# Patient Record
Sex: Male | Born: 1935 | Race: White | Hispanic: No | Smoking: Former smoker
Health system: Southern US, Community
[De-identification: ages and names within clinical notes are randomized; demographics above are authoritative.]

## PROBLEM LIST (undated history)

## (undated) DIAGNOSIS — N390 Urinary tract infection, site not specified: Secondary | ICD-10-CM

## (undated) DIAGNOSIS — M4800 Spinal stenosis, site unspecified: Secondary | ICD-10-CM

## (undated) DIAGNOSIS — E785 Hyperlipidemia, unspecified: Secondary | ICD-10-CM

## (undated) DIAGNOSIS — N400 Enlarged prostate without lower urinary tract symptoms: Secondary | ICD-10-CM

## (undated) DIAGNOSIS — M1990 Unspecified osteoarthritis, unspecified site: Secondary | ICD-10-CM

## (undated) DIAGNOSIS — I10 Essential (primary) hypertension: Secondary | ICD-10-CM

## (undated) DIAGNOSIS — M5126 Other intervertebral disc displacement, lumbar region: Secondary | ICD-10-CM

## (undated) DIAGNOSIS — M5136 Other intervertebral disc degeneration, lumbar region: Secondary | ICD-10-CM

## (undated) DIAGNOSIS — I639 Cerebral infarction, unspecified: Secondary | ICD-10-CM

## (undated) HISTORY — DX: Benign prostatic hyperplasia without lower urinary tract symptoms: N40.0

## (undated) HISTORY — PX: APPENDECTOMY: SHX54

## (undated) HISTORY — DX: Hyperlipidemia, unspecified: E78.5

## (undated) HISTORY — DX: Spinal stenosis, site unspecified: M48.00

## (undated) HISTORY — DX: Cerebral infarction, unspecified: I63.9

## (undated) HISTORY — DX: Unspecified osteoarthritis, unspecified site: M19.90

## (undated) HISTORY — DX: Urinary tract infection, site not specified: N39.0

---

## 2005-07-04 ENCOUNTER — Other Ambulatory Visit: Payer: Self-pay

## 2005-07-04 ENCOUNTER — Emergency Department: Payer: Self-pay | Admitting: Emergency Medicine

## 2005-07-10 ENCOUNTER — Ambulatory Visit: Payer: Self-pay | Admitting: Ophthalmology

## 2005-07-10 ENCOUNTER — Emergency Department: Payer: Self-pay | Admitting: Internal Medicine

## 2005-07-10 ENCOUNTER — Other Ambulatory Visit: Payer: Self-pay

## 2005-07-12 ENCOUNTER — Ambulatory Visit: Payer: Self-pay | Admitting: Internal Medicine

## 2005-08-14 ENCOUNTER — Ambulatory Visit: Payer: Self-pay | Admitting: Gastroenterology

## 2005-11-01 ENCOUNTER — Emergency Department: Payer: Self-pay | Admitting: General Practice

## 2007-12-23 ENCOUNTER — Other Ambulatory Visit: Payer: Self-pay

## 2007-12-23 ENCOUNTER — Emergency Department: Payer: Self-pay | Admitting: Internal Medicine

## 2011-06-07 ENCOUNTER — Emergency Department: Payer: Self-pay | Admitting: Emergency Medicine

## 2011-06-07 LAB — CBC WITH DIFFERENTIAL/PLATELET
Eosinophil #: 0.1 10*3/uL (ref 0.0–0.7)
Lymphocyte #: 1.9 10*3/uL (ref 1.0–3.6)
MCH: 32.5 pg (ref 26.0–34.0)
MCHC: 34 g/dL (ref 32.0–36.0)
MCV: 96 fL (ref 80–100)
Monocyte #: 0.6 10*3/uL (ref 0.0–0.7)
Neutrophil %: 58.7 %
Platelet: 217 10*3/uL (ref 150–440)
RBC: 4.36 10*6/uL — ABNORMAL LOW (ref 4.40–5.90)
RDW: 14.8 % — ABNORMAL HIGH (ref 11.5–14.5)

## 2011-06-07 LAB — COMPREHENSIVE METABOLIC PANEL
Albumin: 3.5 g/dL (ref 3.4–5.0)
Anion Gap: 12 (ref 7–16)
BUN: 18 mg/dL (ref 7–18)
Bilirubin,Total: 0.3 mg/dL (ref 0.2–1.0)
Chloride: 108 mmol/L — ABNORMAL HIGH (ref 98–107)
Creatinine: 0.97 mg/dL (ref 0.60–1.30)
EGFR (African American): >60
Osmolality: 292 (ref 275–301)
Potassium: 3.6 mmol/L (ref 3.5–5.1)
SGOT(AST): 37 U/L (ref 15–37)
Sodium: 145 mmol/L (ref 136–145)
Total Protein: 7.3 g/dL (ref 6.4–8.2)

## 2011-06-07 LAB — PROTIME-INR
INR: 1.2
Prothrombin Time: 15.9 secs — ABNORMAL HIGH (ref 11.5–14.7)

## 2011-06-07 LAB — URINALYSIS, COMPLETE
Bacteria: NONE SEEN
Bilirubin,UR: NEGATIVE
Blood: NEGATIVE
Glucose,UR: NEGATIVE mg/dL (ref 0–75)
Ketone: NEGATIVE
Nitrite: NEGATIVE
Ph: 6 (ref 4.5–8.0)

## 2011-06-07 LAB — CK TOTAL AND CKMB (NOT AT ARMC)
CK, Total: 129 U/L (ref 35–232)
CK-MB: 6.2 ng/mL — ABNORMAL HIGH (ref 0.5–3.6)

## 2011-06-07 LAB — TROPONIN I: Troponin-I: <0.02 ng/mL

## 2011-06-14 ENCOUNTER — Emergency Department: Payer: Self-pay | Admitting: Emergency Medicine

## 2012-06-01 ENCOUNTER — Emergency Department: Payer: Self-pay | Admitting: Unknown Physician Specialty

## 2012-06-01 LAB — CBC
HCT: 44.4 % (ref 40.0–52.0)
HGB: 14.6 g/dL (ref 13.0–18.0)
MCHC: 32.9 g/dL (ref 32.0–36.0)
MCV: 95 fL (ref 80–100)
Platelet: 258 10*3/uL (ref 150–440)
RBC: 4.68 10*6/uL (ref 4.40–5.90)
RDW: 14.2 % (ref 11.5–14.5)
WBC: 13.7 10*3/uL — ABNORMAL HIGH (ref 3.8–10.6)

## 2012-06-01 LAB — URINALYSIS, COMPLETE
Bacteria: NONE SEEN
Blood: NEGATIVE
Glucose,UR: NEGATIVE mg/dL (ref 0–75)
Protein: 30
RBC,UR: 7 /HPF (ref 0–5)
Specific Gravity: 1.032 (ref 1.003–1.030)
WBC UR: 111 /HPF (ref 0–5)

## 2012-06-01 LAB — COMPREHENSIVE METABOLIC PANEL
Albumin: 4 g/dL (ref 3.4–5.0)
Anion Gap: 10 (ref 7–16)
BUN: 28 mg/dL — ABNORMAL HIGH (ref 7–18)
Bilirubin,Total: 0.5 mg/dL (ref 0.2–1.0)
Calcium, Total: 9.4 mg/dL (ref 8.5–10.1)
Chloride: 109 mmol/L — ABNORMAL HIGH (ref 98–107)
Co2: 23 mmol/L (ref 21–32)
Creatinine: 1 mg/dL (ref 0.60–1.30)
EGFR (African American): >60
EGFR (Non-African Amer.): >60
Osmolality: 289 (ref 275–301)
Potassium: 3.9 mmol/L (ref 3.5–5.1)
SGOT(AST): 27 U/L (ref 15–37)
Sodium: 142 mmol/L (ref 136–145)

## 2012-06-01 LAB — TSH: Thyroid Stimulating Horm: 1.53 u[IU]/mL

## 2012-06-01 LAB — MAGNESIUM: Magnesium: 2 mg/dL

## 2012-06-01 LAB — TROPONIN I: Troponin-I: <0.02 ng/mL

## 2012-06-01 LAB — PROTIME-INR: Prothrombin Time: 15 secs — ABNORMAL HIGH (ref 11.5–14.7)

## 2012-07-27 ENCOUNTER — Inpatient Hospital Stay: Payer: Self-pay | Admitting: Internal Medicine

## 2012-07-27 LAB — BASIC METABOLIC PANEL
BUN: 22 mg/dL — ABNORMAL HIGH (ref 7–18)
Chloride: 103 mmol/L (ref 98–107)
EGFR (African American): >60
EGFR (Non-African Amer.): 58 — ABNORMAL LOW
Potassium: 4.5 mmol/L (ref 3.5–5.1)
Sodium: 138 mmol/L (ref 136–145)

## 2012-07-27 LAB — URINALYSIS, COMPLETE
Bilirubin,UR: NEGATIVE
Blood: NEGATIVE
Glucose,UR: NEGATIVE mg/dL (ref 0–75)
Leukocyte Esterase: NEGATIVE
Nitrite: NEGATIVE
Ph: 7 (ref 4.5–8.0)
Protein: NEGATIVE
RBC,UR: NONE SEEN /HPF (ref 0–5)
Specific Gravity: 1.005 (ref 1.003–1.030)
WBC UR: <1 /HPF (ref 0–5)

## 2012-07-27 LAB — CBC WITH DIFFERENTIAL/PLATELET
Basophil #: 0.1 10*3/uL (ref 0.0–0.1)
Basophil %: 1.4 %
Eosinophil #: 0.2 10*3/uL (ref 0.0–0.7)
Lymphocyte #: 1.7 10*3/uL (ref 1.0–3.6)
MCH: 31.7 pg (ref 26.0–34.0)
MCHC: 33.7 g/dL (ref 32.0–36.0)
MCV: 94 fL (ref 80–100)
Neutrophil #: 3.6 10*3/uL (ref 1.4–6.5)
Neutrophil %: 59.5 %
RBC: 4.53 10*6/uL (ref 4.40–5.90)
RDW: 14 % (ref 11.5–14.5)
WBC: 6 10*3/uL (ref 3.8–10.6)

## 2012-07-27 LAB — TROPONIN I: Troponin-I: <0.02 ng/mL

## 2012-07-28 LAB — CBC WITH DIFFERENTIAL/PLATELET
Basophil %: 1 %
Eosinophil #: 0.3 10*3/uL (ref 0.0–0.7)
Eosinophil %: 4.5 %
HCT: 45.8 % (ref 40.0–52.0)
Lymphocyte %: 23 %
Monocyte #: 0.7 x10 3/mm (ref 0.2–1.0)
Monocyte %: 11.1 %
Platelet: 255 10*3/uL (ref 150–440)
RBC: 4.87 10*6/uL (ref 4.40–5.90)
RDW: 14 % (ref 11.5–14.5)
WBC: 6.7 10*3/uL (ref 3.8–10.6)

## 2012-07-28 LAB — CK TOTAL AND CKMB (NOT AT ARMC)
CK, Total: 86 U/L (ref 35–232)
CK-MB: 4.2 ng/mL — ABNORMAL HIGH (ref 0.5–3.6)
CK-MB: 9.4 ng/mL — ABNORMAL HIGH (ref 0.5–3.6)

## 2012-07-28 LAB — BASIC METABOLIC PANEL
BUN: 22 mg/dL — ABNORMAL HIGH (ref 7–18)
Chloride: 107 mmol/L (ref 98–107)
Creatinine: 1.01 mg/dL (ref 0.60–1.30)
EGFR (African American): >60
Potassium: 4.6 mmol/L (ref 3.5–5.1)

## 2012-07-29 LAB — CBC WITH DIFFERENTIAL/PLATELET
Eosinophil #: 0.3 10*3/uL (ref 0.0–0.7)
Eosinophil %: 4 %
HCT: 45.6 % (ref 40.0–52.0)
HGB: 14.9 g/dL (ref 13.0–18.0)
Lymphocyte #: 1.6 10*3/uL (ref 1.0–3.6)
MCH: 30.7 pg (ref 26.0–34.0)
MCV: 94 fL (ref 80–100)
Monocyte #: 0.9 x10 3/mm (ref 0.2–1.0)
Monocyte %: 11.6 %
Neutrophil %: 62.4 %
Platelet: 271 10*3/uL (ref 150–440)
RBC: 4.85 10*6/uL (ref 4.40–5.90)
RDW: 13.8 % (ref 11.5–14.5)

## 2012-07-29 LAB — BASIC METABOLIC PANEL
Anion Gap: 3 — ABNORMAL LOW (ref 7–16)
BUN: 22 mg/dL — ABNORMAL HIGH (ref 7–18)
Co2: 30 mmol/L (ref 21–32)
EGFR (Non-African Amer.): >60

## 2012-07-29 LAB — URINE CULTURE

## 2012-08-26 ENCOUNTER — Ambulatory Visit: Payer: Self-pay

## 2012-09-30 ENCOUNTER — Ambulatory Visit: Payer: Self-pay | Admitting: Neurology

## 2013-03-09 ENCOUNTER — Emergency Department: Payer: Self-pay | Admitting: Emergency Medicine

## 2013-03-09 LAB — COMPREHENSIVE METABOLIC PANEL
Albumin: 3 g/dL — ABNORMAL LOW (ref 3.4–5.0)
Anion Gap: 7 (ref 7–16)
Bilirubin,Total: 0.4 mg/dL (ref 0.2–1.0)
Calcium, Total: 9.2 mg/dL (ref 8.5–10.1)
Co2: 28 mmol/L (ref 21–32)
Glucose: 147 mg/dL — ABNORMAL HIGH (ref 65–99)
Osmolality: 279 (ref 275–301)
Potassium: 3.6 mmol/L (ref 3.5–5.1)
SGOT(AST): 17 U/L (ref 15–37)
SGPT (ALT): 17 U/L (ref 12–78)
Sodium: 137 mmol/L (ref 136–145)
Total Protein: 6.9 g/dL (ref 6.4–8.2)

## 2013-03-09 LAB — URINALYSIS, COMPLETE
Bilirubin,UR: NEGATIVE
Glucose,UR: NEGATIVE mg/dL (ref 0–75)
Nitrite: NEGATIVE
Protein: NEGATIVE
Specific Gravity: 1.01 (ref 1.003–1.030)
Squamous Epithelial: NONE SEEN
WBC UR: 47 /HPF (ref 0–5)

## 2013-03-09 LAB — CBC: RBC: 4.72 10*6/uL (ref 4.40–5.90)

## 2013-03-19 ENCOUNTER — Emergency Department: Payer: Self-pay | Admitting: Emergency Medicine

## 2013-03-19 LAB — URINALYSIS, COMPLETE
Bilirubin,UR: NEGATIVE
Ketone: NEGATIVE
Ph: 6 (ref 4.5–8.0)
Protein: 30
WBC UR: 121 /HPF (ref 0–5)

## 2013-06-23 ENCOUNTER — Emergency Department: Payer: Self-pay | Admitting: Emergency Medicine

## 2013-07-31 ENCOUNTER — Emergency Department: Payer: Self-pay | Admitting: Emergency Medicine

## 2013-07-31 LAB — URINALYSIS, COMPLETE
Bacteria: NONE SEEN
Bilirubin,UR: NEGATIVE
Glucose,UR: NEGATIVE mg/dL (ref 0–75)
KETONE: NEGATIVE
Nitrite: NEGATIVE
PROTEIN: NEGATIVE
Ph: 5 (ref 4.5–8.0)
Specific Gravity: 1.008 (ref 1.003–1.030)
Squamous Epithelial: <1
WBC UR: 17 /HPF (ref 0–5)

## 2013-08-02 LAB — URINE CULTURE

## 2013-08-06 ENCOUNTER — Emergency Department: Payer: Self-pay | Admitting: Emergency Medicine

## 2013-08-07 ENCOUNTER — Emergency Department: Payer: Self-pay | Admitting: Internal Medicine

## 2013-08-07 LAB — URINALYSIS, COMPLETE
Bilirubin,UR: NEGATIVE
GLUCOSE, UR: NEGATIVE mg/dL (ref 0–75)
KETONE: NEGATIVE
NITRITE: NEGATIVE
PH: 6 (ref 4.5–8.0)
PROTEIN: NEGATIVE
RBC,UR: 100 /HPF (ref 0–5)
SPECIFIC GRAVITY: 1.006 (ref 1.003–1.030)
Squamous Epithelial: NONE SEEN
WBC UR: 5 /HPF (ref 0–5)

## 2013-08-09 LAB — URINE CULTURE

## 2014-07-27 ENCOUNTER — Ambulatory Visit
Admit: 2014-07-27 | Disposition: A | Discharge status: Home / Self Care | Payer: Self-pay | Attending: Urology | Admitting: Urology

## 2014-08-05 NOTE — Discharge Summary (Signed)
PATIENT NAME:  Jason Nunez, Jason Nunez MR#:  [id number] DATE OF BIRTH:  [birth year]  DATE OF ADMISSION:  07/27/2012 DATE OF DISCHARGE:  07/29/2012  DIAGNOSES AFTER DISCHARGE: 1.  Weakness of left upper and lower extremities, with unsteadiness, difficulty in ambulation.  2.  Hypertension.  3.  Benign prostatic hypertrophy.  4.  Recent fracture of the left third metatarsal.  5.  Hyperlipidemia.   CHIEF COMPLAINT: Weakness of left upper and lower extremities, unsteadiness, difficult ambulation, decreased leg strength, bipedal edema.   HISTORY OF PRESENT ILLNESS: The patient is a 81 year old male with a history of hypertension, hyperlipidemia, BPH, and recent fall with fracture of the left third metatarsal bone, initially presented to the [facility] complaining of left-sided weakness in both upper and lower extremities. The patient states that he is unable to raise his left arm over his head, and also noticed decreased leg strength, more on the left side. In addition, developed some swelling of both lower extremities; left side appeared to be worse. The patient had fallen in February 2014  and sustained a fracture of the left third metatarsal bone. He had a boot to the left foot. He had been complaining of increased frequency of urination and had recently been treated with Bactrim for a urinary tract infection.   PAST MEDICAL HISTORY: Significant for hypertension, BPH, mini-stroke in 2007. He is also hard of hearing, and has a history of hyperlipidemia.   PAST SURGICAL HISTORY: Significant for an appendectomy.   PHYSICAL EXAMINATION: VITAL SIGNS: He was afebrile, pulse was 60, respirations 18, blood pressure 150/79, O2 sat 94% on room air.  HEENT: Was hard of hearing. Normocephalic, atraumatic. Pupils are equal and reactive to light.  NECK: No thyromegaly.  HEART: S1, S2, regular rhythm.   LUNGS: Clear to auscultation.  ABDOMEN: Soft, nontender.  EXTREMITIES: Bilateral leg edema, left worse than right.  The patient had an orthopedic shoe with an Ace bandage, left foot.  NEUROLOGIC: Alert and oriented x 3. Was hard of hearing. Appeared to have weakness of the left side, especially left upper extremity 3 x 5, left lower extremity 4/5. Flexors symmetric. Plantars were bilaterally downgoing.   The patient was admitted to [facility] for possible CVA. He underwent an MRI of the brain and the patient did not have any acute abnormality. There were chronic ischemic changes, with diffuse atrophy. 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 The patient also underwent an ultrasound of both lower extremities that did not show any evidence of DVT, that was borderline-enlarged bilateral inguinal nodes which were considered to be nonspecific. The patient was seen by physical therapy and during his stay in the hospital his leg swelling did improve. He did have some frequency of urination. He was also started on intravenous Rocephin. This was subsequently stopped, and the patient was discharged in stable condition to a skilled nursing facility.   DISCHARGE MEDICATIONS: Amlodipine/benazepril 5/20, 1 capsule a day, aspirin 325 mg once a day, lovastatin 10 mg once a day, dutasteride 0.5 mg 1 tablet once a day, and acetaminophen 2 tablets every 4 to 6 hours p.r.n. for fever, Ensure 240 mL daily orally.   The patient has been advised a low-sodium diet and to undergo physical therapy. He will be followed up in the clinic with me, Dr. Marcello Fennel, in 1 to 2 weeks' time.   The patient is stable at the time of discharge.   Total time spent in discharge and coordination of care: Thirty-five minutes.     ____________________________ Barbette Reichmann,  MD vh:dm D: 07/29/2012 12:59:30 ET T: 07/29/2012 13:09:06 ET JOB#: [id number]  cc: Barbette ReichmannVishwanath Mystic Labo, MD, <Dictator> Barbette ReichmannVISHWANATH Maurisio Ruddy MD ELECTRONICALLY SIGNED 08/06/2012 13:10

## 2014-08-05 NOTE — H&P (Signed)
DATE OF BIRTH:  April 16, 1919  DATE OF ADMISSION:  07/27/2012  CHIEF COMPLAINT:   1.  Left upper and lower extremity weakness.  2.  Unsteadiness, with difficulty in ambulation.  3.  Decreased leg strength and bilateral ankle and foot edema, left worse than right.   HISTORY OF PRESENT ILLNESS: The patient is a 81 year old male with a history of hypertension, hyperlipidemia, BPH, recent fall with a fracture of the left third metatarsal bone, presents to the clinic today complaining of left-sided weakness of both upper as well as lower extremities. The patient states that he is unable to raise his left arm over his head. He has also noticed decreased leg strength, more on  the left side, and has developed swelling of both lower extremities, left worse than right. The patient fell in February and sustained a fracture of the left third metatarsal bone, and has a boot to the left foot. The patient has been complaining also of increased frequency of urination, and states that he was diagnosed with a urinary tract infection and started on Bactrim last week by his urologist, Dr. Achilles Dunkope. He has been experiencing dizziness, difficulty in getting up to the bathroom, but denies any chest pain. No fevers. No chills.   PAST MEDICAL HISTORY:  Significant for hypertension, BPH, mini stroke in 2007, he is hard of hearing, he also has a history of hyperlipidemia.   PAST SURGICAL HISTORY:  Significant for appendectomy.   FAMILY HISTORY:  Positive for breast cancer in his mother.  REVIEW OF SYSTEMS:  The patient denies any chest pains. No fevers, no chills. No vomiting, no diarrhea. No headaches. No abdominal pain. He has been experiencing weakness, mainly on the left side. No visual disturbances.   CURRENT MEDICATIONS: Avodart 0.5 mg once a day, aspirin 325 mg p.o. daily, amlodipine/benazepril 10/20, 1 capsule a day.   ALLERGIES:  He denies any known drug allergies.   PHYSICAL EXAMINATION: VITAL SIGNS: Temperature  is 98, pulse 60, respirations 18, blood pressure 150/79, pulse ox 94% on room air.   HEENT:  He is hard of hearing. Normocephalic, atraumatic. Pupils are equal and reactive to light. Extraocular movements are intact. Nystagmoid jerks noted.  NECK:  No thyromegaly or lymphadenopathy.  HEART:  S1, S2. Regular rhythm.  LUNGS:  Clear to auscultation bilaterally.  ABDOMEN:  Soft, nontender.  EXTREMITIES:  Bilateral leg edema, left worse than right. The patient has an orthopedic shoe and an Ace bandage to the left foot.  NEUROLOGIC:  The patient is alert and oriented. He is hard of hearing. Cranial nerves are intact. Weakness of the left upper extremity noted, with 3/5 power of the left upper extremity, and 4/5 power in the left lower extremity. He is in a wheelchair. Reflexes are symmetric.  IMPRESSIONS:  1.  Recent onset left-sided weakness, worrisome for cerebrovascular accident.  2.  Hypertension.  3.  History of hyperlipidemia.  4.  History of benign prostatic hypertrophy.  5.  Unsteady gait, with recent fall and healing fracture of the left third metatarsal bone.   PLAN: Ad[facility]. Will obtain MRI of the brain. Will continue his current antihypertensives, and also continue Avodart. Will check urine and culture, and start intravenous Rocephin 1 gram q. 24 for presumed urinary tract infection. Will also check CBC, metabolic panel, cardiac enzymes, EKG, urinalysis and culture. Will consult Physical Therapy. Will consult Neurology, based on results of MRI.   Discussed plan with patient and his daughter, who are both in agreement.  Patient is a DNR.    ____________________________ Barbette Reichmann, MD vh:mr D: 07/27/2012 17:12:02 ET T: 07/27/2012 19:35:19 ET JOB#: [id number]  cc: Barbette Reichmann, MD, <Dictator> Barbette Reichmann MD ELECTRONICALLY SIGNED 07/30/2012 13:14

## 2014-09-07 ENCOUNTER — Emergency Department
Admission: EM | Admit: 2014-09-07 | Discharge: 2014-09-07 | Disposition: A | Discharge status: Home / Self Care | Payer: Medicare Other | Attending: Emergency Medicine | Admitting: Emergency Medicine

## 2014-09-07 ENCOUNTER — Other Ambulatory Visit: Payer: Self-pay

## 2014-09-07 DIAGNOSIS — N390 Urinary tract infection, site not specified: Secondary | ICD-10-CM | POA: Insufficient documentation

## 2014-09-07 DIAGNOSIS — Z7982 Long term (current) use of aspirin: Secondary | ICD-10-CM | POA: Insufficient documentation

## 2014-09-07 DIAGNOSIS — I10 Essential (primary) hypertension: Secondary | ICD-10-CM | POA: Diagnosis not present

## 2014-09-07 DIAGNOSIS — M7989 Other specified soft tissue disorders: Secondary | ICD-10-CM | POA: Diagnosis present

## 2014-09-07 DIAGNOSIS — Z79899 Other long term (current) drug therapy: Secondary | ICD-10-CM | POA: Insufficient documentation

## 2014-09-07 DIAGNOSIS — R609 Edema, unspecified: Secondary | ICD-10-CM

## 2014-09-07 DIAGNOSIS — R531 Weakness: Secondary | ICD-10-CM | POA: Diagnosis not present

## 2014-09-07 DIAGNOSIS — R600 Localized edema: Secondary | ICD-10-CM | POA: Diagnosis not present

## 2014-09-07 HISTORY — DX: Benign prostatic hyperplasia without lower urinary tract symptoms: N40.0

## 2014-09-07 HISTORY — DX: Other intervertebral disc degeneration, lumbar region: M51.36

## 2014-09-07 HISTORY — DX: Other intervertebral disc displacement, lumbar region: M51.26

## 2014-09-07 HISTORY — DX: Essential (primary) hypertension: I10

## 2014-09-07 LAB — HEPATIC FUNCTION PANEL
ALBUMIN: 3.5 g/dL (ref 3.5–5.0)
ALK PHOS: 59 U/L (ref 38–126)
ALT: 16 U/L — AB (ref 17–63)
AST: 19 U/L (ref 15–41)
BILIRUBIN TOTAL: 0.3 mg/dL (ref 0.3–1.2)
Bilirubin, Direct: <0.1 mg/dL — ABNORMAL LOW (ref 0.1–0.5)
Total Protein: 6.8 g/dL (ref 6.5–8.1)

## 2014-09-07 LAB — BASIC METABOLIC PANEL
Anion gap: 9 (ref 5–15)
BUN: 9 mg/dL (ref 6–20)
CO2: 25 mmol/L (ref 22–32)
CREATININE: 0.72 mg/dL (ref 0.61–1.24)
Calcium: 9 mg/dL (ref 8.9–10.3)
Chloride: 96 mmol/L — ABNORMAL LOW (ref 101–111)
GFR calc Af Amer: >60 mL/min (ref >60)
GLUCOSE: 136 mg/dL — AB (ref 65–99)
POTASSIUM: 3.7 mmol/L (ref 3.5–5.1)
Sodium: 130 mmol/L — ABNORMAL LOW (ref 135–145)

## 2014-09-07 LAB — CBC
HCT: 43.5 % (ref 40.0–52.0)
HEMOGLOBIN: 14.3 g/dL (ref 13.0–18.0)
MCH: 29.9 pg (ref 26.0–34.0)
MCHC: 32.8 g/dL (ref 32.0–36.0)
MCV: 91.1 fL (ref 80.0–100.0)
Platelets: 262 10*3/uL (ref 150–440)
RBC: 4.77 MIL/uL (ref 4.40–5.90)
RDW: 15 % — ABNORMAL HIGH (ref 11.5–14.5)
WBC: 7.1 10*3/uL (ref 3.8–10.6)

## 2014-09-07 LAB — URINALYSIS COMPLETE WITH MICROSCOPIC (ARMC ONLY)
BILIRUBIN URINE: NEGATIVE
Glucose, UA: NEGATIVE mg/dL
Ketones, ur: NEGATIVE mg/dL
Nitrite: POSITIVE — AB
PROTEIN: NEGATIVE mg/dL
Specific Gravity, Urine: 1.013 (ref 1.005–1.030)
pH: 6 (ref 5.0–8.0)

## 2014-09-07 LAB — TROPONIN I: Troponin I: <0.03 ng/mL (ref <0.031)

## 2014-09-07 LAB — TSH: TSH: 2.172 u[IU]/mL (ref 0.350–4.500)

## 2014-09-07 MED ORDER — CEFUROXIME AXETIL 500 MG PO TABS: 500.0000 mg | ORAL_TABLET | Freq: Two times a day (BID) | ORAL | Status: DC

## 2014-09-07 MED ORDER — CEFUROXIME AXETIL 500 MG PO TABS: 500.0000 mg | ORAL_TABLET | Freq: Two times a day (BID) | ORAL | Status: AC

## 2014-09-07 NOTE — ED Notes (Signed)
Discussed patient sx of newly leaning over in chair, no further orders received. Pt baseline has mobility problems, uses lift for mobility.

## 2014-09-07 NOTE — ED Provider Notes (Signed)
[facility] Emergency Department Provider Note  ____________________________________________  Time seen: Approximately 450 PM  I have reviewed the triage vital signs and the nursing notes.   HISTORY  Chief Complaint Leg Swelling and Hand Problem    HPI Jason Nunez is a 81 y.o. male with a history of severe degenerative disease of the spine with weakness chronically to the lower extremities presents with bilateral upper extremity weakness and slumping over with swelling of his hands and his feet. His daughter is at the bedside and says that he has been having intermittent swelling of his hands and feet which is improved today. She says it was worse several hours ago. She is concerned that he is having a repeat urinary tract infection because he also appears weak. She says that he was slumped over today and usually has better strength in his trunk. He has non-ambulatory since March and needs a lift to be transported. He has an indwelling Foley since 2014. She says there has been sediment in the Foley over about the past week. He has had multiple recent antibiotics for urinary tract infection including Cipro and Macrobid.He denies any pain at this time. He has not had any decrease in his mental status.   Past Medical History  Diagnosis Date  . Enlarged prostate   . Bulging lumbar disc   . Hypertension     There are no active problems to display for this patient.   History reviewed. No pertinent past surgical history.  Current Outpatient Rx  Name  Route  Sig  Dispense  Refill  . acetaminophen (TYLENOL) 650 MG CR tablet   Oral   Take 1,300 mg by mouth every 8 (eight) hours as needed for pain.         Marland Kitchen aspirin 325 MG tablet   Oral   Take 325 mg by mouth daily.         Marland Kitchen dutasteride (AVODART) 0.5 MG capsule   Oral   Take 0.5 mg by mouth daily.         . Probiotic Product (PROBIOTIC COLON SUPPORT PO)   Oral   Take 1 capsule by mouth daily before  breakfast.         . tamsulosin (FLOMAX) 0.4 MG CAPS capsule   Oral   Take 0.4 mg by mouth daily.           Allergies Review of patient's allergies indicates no known allergies.  No family history on file.  Social History History  Substance Use Topics  . Smoking status: Never Smoker   . Smokeless tobacco: Not on file  . Alcohol Use: No    Review of Systems Constitutional: No fever/chills Eyes: No visual changes. ENT: No sore throat. Cardiovascular: Denies chest pain. Respiratory: Denies shortness of breath. Gastrointestinal: No abdominal pain.  No nausea, no vomiting.  No diarrhea.  No constipation. Genitourinary: Negative for dysuria. Musculoskeletal: Negative for back pain. Skin: Negative for rash. Neurological: Negative for headaches, increasing weakness to the bilateral upper extremities 10-point ROS otherwise negative.  ____________________________________________   PHYSICAL EXAM:  VITAL SIGNS: ED Triage Vitals  Enc Vitals Group     BP 09/07/14 1435 153/70 mmHg     Pulse Rate 09/07/14 1435 60     Resp 09/07/14 1435 18     Temp 09/07/14 1435 97.5 F (36.4 C)     Temp Source 09/07/14 1435 Oral     SpO2 09/07/14 1435 92 %     Weight 09/07/14 1435  145 lb (65.772 kg)     Height 09/07/14 1435 5\' 8"  (1.727 m)     Head Cir --      Peak Flow --      Pain Score --      Pain Loc --      Pain Edu? --      Excl. in GC? --     Constitutional: Alert and oriented. Well appearing and in no acute distress. Eyes: Conjunctivae are normal. PERRL. EOMI. Head: Atraumatic. Nose: No congestion/rhinnorhea. Mouth/Throat: Mucous membranes are moist.  Oropharynx non-erythematous. Neck: No stridor.   Cardiovascular: Normal rate, regular rhythm. Grossly normal heart sounds.  Good peripheral circulation. Respiratory: Normal respiratory effort.  No retractions. Lungs CTAB. Gastrointestinal: Soft and nontender. No distention. No abdominal bruits. No CVA  tenderness. Genitourinary:  Indwelling Foley with hazy urine in Foley bag. Small amount of sediment tubing. No foul odor to the groin. There is otherwise a normal genital exam.  Musculoskeletal: Mild to moderate pitting edema to the bilateral hands as well as feet and calves. Is no redness or induration..  No joint effusions. Neurologic:  Normal speech and language. 4-5 strength to bilateral hands and feet. 2 out of 5 strength to the lower and upper extremities bilaterally. Dorsalis pedis pulses and radial pulses present and equal bilaterally.  Skin:  Skin is warm, dry and intact. No rash noted. Psychiatric: Mood and affect are normal. Speech and behavior are normal.  ____________________________________________   LABS (all labs ordered are listed, but only abnormal results are displayed)  Labs Reviewed  CBC - Abnormal; Notable for the following:    RDW 15.0 (*)    All other components within normal limits  BASIC METABOLIC PANEL - Abnormal; Notable for the following:    Sodium 130 (*)    Chloride 96 (*)    Glucose, Bld 136 (*)    All other components within normal limits  HEPATIC FUNCTION PANEL - Abnormal; Notable for the following:    ALT 16 (*)    Bilirubin, Direct <0.1 (*)    All other components within normal limits  URINALYSIS COMPLETEWITH MICROSCOPIC ([facility] ONLY) - Abnormal; Notable for the following:    Color, Urine YELLOW (*)    APPearance CLOUDY (*)    Hgb urine dipstick 1+ (*)    Nitrite POSITIVE (*)    Leukocytes, UA 3+ (*)    Bacteria, UA MANY (*)    Squamous Epithelial / LPF 0-5 (*)    All other components within normal limits  URINE CULTURE  TSH  TROPONIN I   ____________________________________________  EKG  ED ECG REPORT I, Arelia LongestSchaevitz,  David M, the attending physician, personally viewed and interpreted this ECG.   Date: 09/07/2014  EKG Time: 1445  Rate: 62  Rhythm: Sinus rhythm with PACs  Axis: Normal axis  Intervals:none  ST&T Change: No ST  elevations or depressions. There are no abnormal T-wave inversions.  ____________________________________________  RADIOLOGY    ____________________________________________   PROCEDURES    ____________________________________________   INITIAL IMPRESSION / ASSESSMENT AND PLAN / ED COURSE  Pertinent labs & imaging results that were available during my care of the patient were reviewed by me and considered in my medical decision making (see chart for details).  ----------------------------------------- 7:51 PM on 09/07/2014 -----------------------------------------  Patient resting comfortably. Appears to have urinary tract infection. We'll discharge with antibiotics. Will follow up with primary care doctor within 7 days. Likely increased weakness secondary to UTI. Family says lasted about is about  2-1/2 weeks ago when the symptoms began. Symptoms are symmetric and diffuse. Unlikely to be acute ischemic event. ____________________________________________   FINAL CLINICAL IMPRESSION(S) / ED DIAGNOSES  Urinary tract infection. Acute peripheral edema. Acute common initial visit. Acute weakness.    Myrna Blazer, MD 09/07/14 [phone]

## 2014-09-07 NOTE — ED Notes (Signed)
Pt brought to ER because he has been experiencing swelling in bilateral hands and feet. Daughter states pt has recently began to hunch over in the last 30-45 minutes today. Pt alert and oriented X4. Pt states that he is not in pain but "no feeling all over".

## 2014-09-07 NOTE — ED Notes (Signed)
EKG 1445

## 2014-09-10 LAB — URINE CULTURE: Culture: >=100000

## 2014-09-16 ENCOUNTER — Encounter: Payer: Self-pay | Admitting: Family Medicine

## 2014-09-16 ENCOUNTER — Ambulatory Visit (INDEPENDENT_AMBULATORY_CARE_PROVIDER_SITE_OTHER): Payer: Medicare Other | Admitting: Family Medicine

## 2014-09-16 VITALS — BP 175/85 | HR 54 | Temp 98.1°F

## 2014-09-16 DIAGNOSIS — Z515 Encounter for palliative care: Secondary | ICD-10-CM

## 2014-09-16 DIAGNOSIS — M4712 Other spondylosis with myelopathy, cervical region: Secondary | ICD-10-CM | POA: Diagnosis not present

## 2014-09-16 DIAGNOSIS — R531 Weakness: Secondary | ICD-10-CM | POA: Diagnosis not present

## 2014-09-16 DIAGNOSIS — M5126 Other intervertebral disc displacement, lumbar region: Secondary | ICD-10-CM | POA: Insufficient documentation

## 2014-09-16 DIAGNOSIS — I10 Essential (primary) hypertension: Secondary | ICD-10-CM | POA: Diagnosis not present

## 2014-09-16 DIAGNOSIS — N390 Urinary tract infection, site not specified: Secondary | ICD-10-CM | POA: Diagnosis not present

## 2014-09-16 DIAGNOSIS — R609 Edema, unspecified: Secondary | ICD-10-CM | POA: Diagnosis not present

## 2014-09-16 DIAGNOSIS — N400 Enlarged prostate without lower urinary tract symptoms: Secondary | ICD-10-CM | POA: Insufficient documentation

## 2014-09-16 DIAGNOSIS — M4800 Spinal stenosis, site unspecified: Secondary | ICD-10-CM | POA: Insufficient documentation

## 2014-09-16 DIAGNOSIS — M5136 Other intervertebral disc degeneration, lumbar region: Secondary | ICD-10-CM | POA: Insufficient documentation

## 2014-09-16 DIAGNOSIS — M1990 Unspecified osteoarthritis, unspecified site: Secondary | ICD-10-CM | POA: Insufficient documentation

## 2014-09-16 NOTE — Assessment & Plan Note (Signed)
Discussed with patient, caregiver and daughter that this may be contributing to the worsening weakness, but without further evaluation, we cannot know. They are not interested in referral to orthopedics, MRI or other interventions. He signed a MOST for CMO and DNR today. Continue to monitor. They may be interested in OT in the future if it would allow him to feed himself.

## 2014-09-16 NOTE — Assessment & Plan Note (Signed)
Patient is signing a MOST today for CMO. BP OK given age and risk of falls. Continue to monitor.

## 2014-09-16 NOTE — Assessment & Plan Note (Addendum)
Patient wants to be permanent DNR. MOST signed today. Also not interested in medical interventions. CMO, no antibiotics. No IV fluids or feeding tubes. Discussed with patient, caregiver and daughter (POA today). MOST form signed and original sent home with patient today. Referral for hospice generated yesterday and orders signed today. They will come in and work with patient and his family.

## 2014-09-16 NOTE — Assessment & Plan Note (Signed)
Finishing abx now. Patient and daughter not interested in treating any future UTIs. Would like to start cranberry pills and probiotics to try to avoid further UTIs. Continue to follow with urology as needed. Not interested in suprapubic catheter.

## 2014-09-16 NOTE — Progress Notes (Addendum)
BP 175/85 mmHg  Pulse 54  Temp(Src) 98.1 F (36.7 C)  SpO2 95%   Subjective:    Patient ID: Jason Nunez, male    DOB: Mar 22, 1920, 81 y.o.   MRN: [id number]  HPI: Jason Nunez is a 81 y.o. male presenting on 09/16/2014 for Follow-up; Edema; and Spasms  ER FOLLOW UP- Filippo went to the ER on 09/07/14 for swelling in his hands and weakness. He was diagnosed with another UTI and treated with ceftin, which he's still finishing. He notes that he is feeling better since getting treated, but he still seems to be weak and not feeling like himself.  Time since discharge: 10 days Hospital/facility: [facility] Diagnosis: UTI Procedures/tests:  CBC. BMP, LFTs, UA, Urine Cx, TSH, Trop, EKG,  Consultants: None New medications: Ceftin  BID x 10 days Discharge instructions:  Follow up with PCP in 7 days Status: better  EDEMA- His daughter and his caregiver note that he has been having a lot more swelling of his hands and his legs. He has been significantly more weak since the first round of his antibiotics with his UTIs and has not been doing well. He now has trouble lifting his arms and is no longer able to feed himself. They also notes that he has been having more swelling in his abdominal area.   WEAKNESS-  They have noticed that since he has taken his abx for his UTIs, he has been having trouble with muscle weakness, he has not been able to feed himself, he is getting more tired, his voice is weaker. He is having a slight problem swallowing water with medications at night and has been having more muscle spasm in hte moreing with activity (bathing and dressing). He has been napping more. His blood pressure has been good. He has had a good appetite. He also has has dome dry skin in patches on his face and his heels.   DNR- He signed a DNR when he was in the ER. He would like to see hospice and talk with them. Hospice order signed yesterday.   Relevant past medical, surgical, family and social history reviewed  and updated as indicated. Interim medical history since our last visit reviewed. Allergies and medications reviewed and updated.  Current Outpatient Prescriptions on File Prior to Visit  Medication Sig  . acetaminophen (TYLENOL) 650 MG CR tablet Take 1,300 mg by mouth every 8 (eight) hours as needed for pain.  Marland Kitchen aspirin 325 MG tablet Take 325 mg by mouth daily.  . cefUROXime (CEFTIN) 500 MG tablet Take 1 tablet (500 mg total) by mouth 2 (two) times daily. (Patient not taking: Reported on 09/14/2014)  . dutasteride (AVODART) 0.5 MG capsule Take 0.5 mg by mouth daily.  . Probiotic Product (PROBIOTIC COLON SUPPORT PO) Take 1 capsule by mouth daily before breakfast.  . tamsulosin (FLOMAX) 0.4 MG CAPS capsule Take 0.4 mg by mouth daily.   No current facility-administered medications on file prior to visit.    Review of Systems  HENT: Negative.   Eyes: Negative.   Respiratory: Negative.   Cardiovascular: Negative.   Neurological: Positive for weakness.  Psychiatric/Behavioral: Negative.     Per HPI unless specifically indicated above     Objective:    BP 175/85 mmHg  Pulse 54  Temp(Src) 98.1 F (36.7 C)  SpO2 95%  Wt Readings from Last 3 Encounters:  09/07/14 145 lb (65.772 kg)    Physical Exam  Constitutional: He is oriented to person, place, and time.  He appears well-developed and well-nourished.  Wheelchair bound, weakness throughout, especially L side  HENT:  Head: Normocephalic and atraumatic.  Eyes: Pupils are equal, round, and reactive to light.  Neck: Normal range of motion. Neck supple.  Cardiovascular: Normal rate, regular rhythm, normal heart sounds and intact distal pulses.   Pulmonary/Chest: Effort normal and breath sounds normal.  Abdominal: Soft. Bowel sounds are normal. He exhibits no distension and no mass. There is no tenderness. There is no rebound and no guarding.  Genitourinary:  Indwelling foley in place  Musculoskeletal: He exhibits edema.  2+ edema R  hand, 1+ edema L hand, 1+ edema R leg, trace edema L leg  Neurological: He is alert and oriented to person, place, and time.  Skin: Skin is warm and dry.  Nursing note and vitals reviewed.   Results for orders placed or performed during the hospital encounter of 09/07/14  Urine culture  Result Value Ref Range   Specimen Description URINE, RANDOM    Special Requests NONE    Culture      >=100,000 COLONIES/mL ESCHERICHIA COLI >=100,000 COLONIES/mL KLEBSIELLA PNEUMONIAE    Report Status 09/10/2014 FINAL    Organism ID, Bacteria ESCHERICHIA COLI    Organism ID, Bacteria KLEBSIELLA PNEUMONIAE       Susceptibility   Escherichia coli - MIC*    AMPICILLIN 4 SENSITIVE Sensitive     CEFTAZIDIME <=1 SENSITIVE Sensitive     CEFAZOLIN <=4 SENSITIVE Sensitive     CEFTRIAXONE <=1 SENSITIVE Sensitive     CIPROFLOXACIN <=0.25 SENSITIVE Sensitive     GENTAMICIN <=1 SENSITIVE Sensitive     IMIPENEM <=0.25 SENSITIVE Sensitive     TRIMETH/SULFA <=20 SENSITIVE Sensitive     CEFOXITIN <=4 SENSITIVE Sensitive     * >=100,000 COLONIES/mL ESCHERICHIA COLI   Klebsiella pneumoniae - MIC*    AMPICILLIN >=32 RESISTANT Resistant     CEFTAZIDIME <=1 SENSITIVE Sensitive     CEFAZOLIN <=4 SENSITIVE Sensitive     CEFTRIAXONE <=1 SENSITIVE Sensitive     CIPROFLOXACIN <=0.25 SENSITIVE Sensitive     GENTAMICIN <=1 SENSITIVE Sensitive     IMIPENEM <=0.25 SENSITIVE Sensitive     TRIMETH/SULFA <=20 SENSITIVE Sensitive     CEFOXITIN <=4 SENSITIVE Sensitive     * >=100,000 COLONIES/mL KLEBSIELLA PNEUMONIAE  CBC  Result Value Ref Range   WBC 7.1 3.8 - 10.6 K/uL   RBC 4.77 4.40 - 5.90 MIL/uL   Hemoglobin 14.3 13.0 - 18.0 g/dL   HCT 16.143.5 09.640.0 - 04.552.0 %   MCV 91.1 80.0 - 100.0 fL   MCH 29.9 26.0 - 34.0 pg   MCHC 32.8 32.0 - 36.0 g/dL   RDW 40.915.0 (H) 81.111.5 - 91.414.5 %   Platelets 262 150 - 440 K/uL  Basic metabolic panel  Result Value Ref Range   Sodium 130 (L) 135 - 145 mmol/L   Potassium 3.7 3.5 - 5.1 mmol/L    Chloride 96 (L) 101 - 111 mmol/L   CO2 25 22 - 32 mmol/L   Glucose, Bld 136 (H) 65 - 99 mg/dL   BUN 9 6 - 20 mg/dL   Creatinine, Ser 7.820.72 0.61 - 1.24 mg/dL   Calcium 9.0 8.9 - 95.610.3 mg/dL   GFR calc non Af Amer >60 >60 mL/min   GFR calc Af Amer >60 >60 mL/min   Anion gap 9 5 - 15  Hepatic function panel  Result Value Ref Range   Total Protein 6.8 6.5 - 8.1 g/dL   Albumin  3.5 3.5 - 5.0 g/dL   AST 19 15 - 41 U/L   ALT 16 (L) 17 - 63 U/L   Alkaline Phosphatase 59 38 - 126 U/L   Total Bilirubin 0.3 0.3 - 1.2 mg/dL   Bilirubin, Direct <1.6 (L) 0.1 - 0.5 mg/dL   Indirect Bilirubin NOT CALCULATED 0.3 - 0.9 mg/dL  TSH  Result Value Ref Range   TSH 2.172 0.350 - 4.500 uIU/mL  Urinalysis complete, with microscopic [facility])  Result Value Ref Range   Color, Urine YELLOW (A) YELLOW   APPearance CLOUDY (A) CLEAR   Glucose, UA NEGATIVE NEGATIVE mg/dL   Bilirubin Urine NEGATIVE NEGATIVE   Ketones, ur NEGATIVE NEGATIVE mg/dL   Specific Gravity, Urine 1.013 1.005 - 1.030   Hgb urine dipstick 1+ (A) NEGATIVE   pH 6.0 5.0 - 8.0   Protein, ur NEGATIVE NEGATIVE mg/dL   Nitrite POSITIVE (A) NEGATIVE   Leukocytes, UA 3+ (A) NEGATIVE   RBC / HPF 0-5 0 - 5 RBC/hpf   WBC, UA TOO NUMEROUS TO COUNT 0 - 5 WBC/hpf   Bacteria, UA MANY (A) NONE SEEN   Squamous Epithelial / LPF 0-5 (A) NONE SEEN   Mucous PRESENT   Troponin I  Result Value Ref Range   Troponin I <0.03 <0.031 ng/mL      Assessment & Plan:   Problem List Items Addressed This Visit    DJD (degenerative joint disease)    Discussed with patient, caregiver and daughter that this may be contributing to the worsening weakness, but without further evaluation, we cannot know. They are not interested in referral to orthopedics, MRI or other interventions. He signed a MOST for CMO and DNR today. Continue to monitor. They may be interested in OT in the future if it would allow him to feed himself.       Hypertension - Primary    Patient is signing  a MOST today for CMO. BP OK given age and risk of falls. Continue to monitor.       Recurrent UTI    Finishing abx now. Patient and daughter not interested in treating any future UTIs. Would like to start cranberry pills and probiotics to try to avoid further UTIs. Continue to follow with urology as needed. Not interested in suprapubic catheter.       Encounter for hospice care    Other Visit Diagnoses    Weakness        Multifactorial. Some of the reasons discussed above. Continue to montior. Family and patient not interested in working it up.     Edema        Due to dependent positioning. Rx for lymphedema gloves given today. Work on elevation.         Follow up plan: Return in about 4 weeks (around 10/14/2014) for follow up.   Addendum: Advanced directives added to his chart and scanned into his chart.

## 2014-09-16 NOTE — Patient Instructions (Signed)
Edema  Edema is an abnormal buildup of fluids. It is more common in your legs and thighs. Painless swelling of the feet and ankles is more likely as a person ages. It also is common in looser skin, like around your eyes.  HOME CARE   · Keep the affected body part above the level of the heart while lying down.  · Do not sit still or stand for a long time.  · Do not put anything right under your knees when you lie down.  · Do not wear tight clothes on your upper legs.  · Exercise your legs to help the puffiness (swelling) go down.  · Wear elastic bandages or support stockings as told by your doctor.  · A low-salt diet may help lessen the puffiness.  · Only take medicine as told by your doctor.  GET HELP IF:  · Treatment is not working.  · You have heart, liver, or kidney disease and notice that your skin looks puffy or shiny.  · You have puffiness in your legs that does not get better when you raise your legs.  · You have sudden weight gain for no reason.  GET HELP RIGHT AWAY IF:   · You have shortness of breath or chest pain.  · You cannot breathe when you lie down.  · You have pain, redness, or warmth in the areas that are puffy.  · You have heart, liver, or kidney disease and get edema all of a sudden.  · You have a fever and your symptoms get worse all of a sudden.  MAKE SURE YOU:   · Understand these instructions.  · Will watch your condition.  · Will get help right away if you are not doing well or get worse.  Document Released: 09/18/2007 Document Revised: 04/06/2013 Document Reviewed: 01/22/2013  [employer]® Patient Information ©2015 [employer]. This information is not intended to replace advice given to you by your health care provider. Make sure you discuss any questions you have with your health care provider.

## 2014-10-14 ENCOUNTER — Encounter: Payer: Self-pay | Admitting: Family Medicine

## 2014-10-14 ENCOUNTER — Ambulatory Visit (INDEPENDENT_AMBULATORY_CARE_PROVIDER_SITE_OTHER): Payer: Medicare Other | Admitting: Family Medicine

## 2014-10-14 VITALS — BP 154/68 | HR 54 | Ht 68.0 in | Wt 145.0 lb

## 2014-10-14 DIAGNOSIS — I10 Essential (primary) hypertension: Secondary | ICD-10-CM | POA: Diagnosis not present

## 2014-10-14 DIAGNOSIS — Z515 Encounter for palliative care: Secondary | ICD-10-CM

## 2014-10-14 DIAGNOSIS — R609 Edema, unspecified: Secondary | ICD-10-CM | POA: Diagnosis not present

## 2014-10-14 NOTE — Progress Notes (Signed)
BP 154/68 mmHg  Pulse 54  Ht 5\' 8"  (1.727 m)  Wt 145 lb (65.772 kg)  BMI 22.05 kg/m2  SpO2 95%   Subjective:    Patient ID: Jason Nunez, male    DOB: 04/28/1919, 81 y.o.   MRN: [id number]  HPI: Jason PolioCarl Schertzer is a 81 y.o. male  Chief Complaint  Patient presents with  . Edema    Arms Bilaterally   He has been doing much better. They are working with the community home care and that is doing well. Edema has been significantly better since they have found the right elevation of his arms. He is able to move his arms a little more. Comfortable with the hospice providers. BP has been stable at home- he gets irritated when he comes to the doctor. Urine is doing better with the cranberry and the probiotics. Seems to be stable per his daughter. No concerns or complaints at this time.   Relevant past medical, surgical, family and social history reviewed and updated as indicated. Interim medical history since our last visit reviewed. Allergies and medications reviewed and updated.  Review of Systems  Respiratory: Negative.   Cardiovascular: Negative.   Gastrointestinal: Negative.   Genitourinary: Negative.   Neurological: Positive for speech difficulty and weakness. Negative for dizziness, tremors, seizures, syncope, facial asymmetry, light-headedness, numbness and headaches.  Psychiatric/Behavioral: Negative.    Per HPI unless specifically indicated above    Objective:    BP 154/68 mmHg  Pulse 54  Ht 5\' 8"  (1.727 m)  Wt 145 lb (65.772 kg)  BMI 22.05 kg/m2  SpO2 95%  Wt Readings from Last 3 Encounters:  10/14/14 145 lb (65.772 kg)  09/07/14 145 lb (65.772 kg)    Physical Exam  Constitutional: He is oriented to person, place, and time. He appears well-developed and well-nourished. No distress.  Wheelchair bound  HENT:  Head: Normocephalic and atraumatic.  Right Ear: Hearing normal.  Left Ear: Hearing normal.  Nose: Nose normal.  Eyes: Conjunctivae and lids are normal. Right eye  exhibits no discharge. Left eye exhibits no discharge. No scleral icterus.  Cardiovascular: Normal rate, regular rhythm and normal heart sounds.  Exam reveals no gallop and no friction rub.   No murmur heard. Pulmonary/Chest: Effort normal and breath sounds normal. No respiratory distress. He has no wheezes. He has no rales. He exhibits no tenderness.  Musculoskeletal: He exhibits edema.  Trace edema in his hands bilaterally, significantly improved from prior.   Neurological: He is alert and oriented to person, place, and time.  Skin: Skin is warm, dry and intact. No rash noted. No erythema. No pallor.  Psychiatric: He has a normal mood and affect. His speech is normal and behavior is normal. Judgment and thought content normal. Cognition and memory are normal.      Assessment & Plan:   Problem List Items Addressed This Visit      Cardiovascular and Mediastinum   Hypertension    Better on recheck. Better at home through hospice care. Stable. Continue medications for BPH continue to monitor.         Other   Peripheral edema - Primary    Doing significantly better with elevation. Comfortable. Continue elevation. Continue to monitor.       Hospice care patient    Stable. No medications that need to be discontinued at this time. Continue in home care. MOLST signed and on file and at home. Continue to monitor.  Follow up plan: Return in about 3 months (around 01/14/2015).

## 2014-10-14 NOTE — Assessment & Plan Note (Signed)
Doing significantly better with elevation. Comfortable. Continue elevation. Continue to monitor.

## 2014-10-14 NOTE — Assessment & Plan Note (Signed)
Stable. No medications that need to be discontinued at this time. Continue in home care. MOLST signed and on file and at home. Continue to monitor.

## 2014-10-14 NOTE — Assessment & Plan Note (Signed)
Better on recheck. Better at home through hospice care. Stable. Continue medications for BPH continue to monitor.

## 2014-10-26 ENCOUNTER — Other Ambulatory Visit: Payer: Self-pay | Admitting: Urology

## 2014-10-26 DIAGNOSIS — R339 Retention of urine, unspecified: Secondary | ICD-10-CM

## 2014-11-03 ENCOUNTER — Telehealth: Payer: Self-pay

## 2014-11-03 DIAGNOSIS — N400 Enlarged prostate without lower urinary tract symptoms: Secondary | ICD-10-CM

## 2014-11-03 NOTE — Telephone Encounter (Signed)
Pt pharmacy sent a medication refill for flomax. Pt was last seen in May for urinary retention and possible TUMT.

## 2015-01-04 MED ORDER — TAMSULOSIN HCL 0.4 MG PO CAPS: 0.4000 mg | ORAL_CAPSULE | Freq: Every day | ORAL | Status: DC

## 2015-01-04 NOTE — Telephone Encounter (Signed)
Medication called in to pharmacy.

## 2015-01-04 NOTE — Telephone Encounter (Signed)
I do not see where this issue had been addressed in the EMR.   I believe it is okay to send in the medication refill for Flomax for this patient.

## 2015-01-16 ENCOUNTER — Ambulatory Visit: Payer: Medicare Other | Admitting: Family Medicine

## 2015-01-19 ENCOUNTER — Encounter: Payer: Self-pay | Admitting: Family Medicine

## 2015-01-19 ENCOUNTER — Ambulatory Visit (INDEPENDENT_AMBULATORY_CARE_PROVIDER_SITE_OTHER): Payer: Medicare Other | Admitting: Family Medicine

## 2015-01-19 VITALS — BP 166/82 | HR 66 | Temp 98.3°F

## 2015-01-19 DIAGNOSIS — N400 Enlarged prostate without lower urinary tract symptoms: Secondary | ICD-10-CM | POA: Diagnosis not present

## 2015-01-19 DIAGNOSIS — I10 Essential (primary) hypertension: Secondary | ICD-10-CM

## 2015-01-19 DIAGNOSIS — R339 Retention of urine, unspecified: Secondary | ICD-10-CM

## 2015-01-19 DIAGNOSIS — R609 Edema, unspecified: Secondary | ICD-10-CM

## 2015-01-19 DIAGNOSIS — Z515 Encounter for palliative care: Secondary | ICD-10-CM

## 2015-01-19 MED ORDER — TAMSULOSIN HCL 0.4 MG PO CAPS: 0.4000 mg | ORAL_CAPSULE | Freq: Every day | ORAL | Status: AC

## 2015-01-19 MED ORDER — DUTASTERIDE 0.5 MG PO CAPS: 0.5000 mg | ORAL_CAPSULE | Freq: Every day | ORAL | Status: AC

## 2015-01-19 NOTE — Assessment & Plan Note (Signed)
Better at home through hospice care. Stable. Continue medications for BPH continue to monitor.

## 2015-01-19 NOTE — Progress Notes (Signed)
BP 166/82 mmHg  Pulse 66  Temp(Src) 98.3 F (36.8 C)  Ht   Wt   SpO2 99%   Subjective:    Patient ID: Jason Nunez, male    DOB: 08-03-1919, 81 y.o.   MRN: [id number]  HPI: Jason Nunez is a 81 y.o. male  Chief Complaint  Patient presents with  . Follow-up   Having good days and bad days.  Swelling comes and goes.  Urine is doing OK when he drinks a lot of water. Cranberry tablets seem to be helping.  Mood seems to be up and down.  Resting better in bed.  Seems to want to be in bed more.  Doing better on the oxygen- feeling more comfortable.  Bowel movements are doing well.  Feels like the Hospice helpers are helping and making both his and his daugher  Relevant past medical, surgical, family and social history reviewed and updated as indicated. Interim medical history since our last visit reviewed. Allergies and medications reviewed and updated.  Review of Systems  Constitutional: Negative.   Respiratory: Negative.   Cardiovascular: Negative.   Musculoskeletal: Negative.   Psychiatric/Behavioral: Negative.     Per HPI unless specifically indicated above     Objective:    BP 166/82 mmHg  Pulse 66  Temp(Src) 98.3 F (36.8 C)  Ht   Wt   SpO2 99%  Wt Readings from Last 3 Encounters:  10/14/14 145 lb (65.772 kg)  09/07/14 145 lb (65.772 kg)    Physical Exam  Constitutional: He is oriented to person, place, and time. He appears well-developed and well-nourished. No distress.  HENT:  Head: Normocephalic and atraumatic.  Right Ear: Hearing normal.  Left Ear: Hearing normal.  Nose: Nose normal.  Eyes: Conjunctivae and lids are normal. Right eye exhibits no discharge. Left eye exhibits no discharge. No scleral icterus.  Cardiovascular: Normal rate, regular rhythm, normal heart sounds and intact distal pulses.  Exam reveals no gallop and no friction rub.   No murmur heard. Pulmonary/Chest: Effort normal and breath sounds normal. No respiratory distress. He has no  wheezes. He has no rales. He exhibits no tenderness.  Musculoskeletal: Normal range of motion. He exhibits edema.  Trace edema bilateral arms  Neurological: He is alert and oriented to person, place, and time.  Skin: Skin is intact. No rash noted. He is not diaphoretic.  Psychiatric: Judgment and thought content normal. His speech is delayed. He is slowed and withdrawn. Cognition and memory are normal. He exhibits a depressed mood.  Nursing note and vitals reviewed.   Results for orders placed or performed during the hospital encounter of 09/07/14  Urine culture  Result Value Ref Range   Specimen Description URINE, RANDOM    Special Requests NONE    Culture      >=100,000 COLONIES/mL ESCHERICHIA COLI >=100,000 COLONIES/mL KLEBSIELLA PNEUMONIAE    Report Status 09/10/2014 FINAL    Organism ID, Bacteria ESCHERICHIA COLI    Organism ID, Bacteria KLEBSIELLA PNEUMONIAE       Susceptibility   Escherichia coli - MIC*    AMPICILLIN 4 SENSITIVE Sensitive     CEFTAZIDIME <=1 SENSITIVE Sensitive     CEFAZOLIN <=4 SENSITIVE Sensitive     CEFTRIAXONE <=1 SENSITIVE Sensitive     CIPROFLOXACIN <=0.25 SENSITIVE Sensitive     GENTAMICIN <=1 SENSITIVE Sensitive     IMIPENEM <=0.25 SENSITIVE Sensitive     TRIMETH/SULFA <=20 SENSITIVE Sensitive     CEFOXITIN <=4 SENSITIVE Sensitive     * >=  100,000 COLONIES/mL ESCHERICHIA COLI   Klebsiella pneumoniae - MIC*    AMPICILLIN >=32 RESISTANT Resistant     CEFTAZIDIME <=1 SENSITIVE Sensitive     CEFAZOLIN <=4 SENSITIVE Sensitive     CEFTRIAXONE <=1 SENSITIVE Sensitive     CIPROFLOXACIN <=0.25 SENSITIVE Sensitive     GENTAMICIN <=1 SENSITIVE Sensitive     IMIPENEM <=0.25 SENSITIVE Sensitive     TRIMETH/SULFA <=20 SENSITIVE Sensitive     CEFOXITIN <=4 SENSITIVE Sensitive     * >=100,000 COLONIES/mL KLEBSIELLA PNEUMONIAE  CBC  Result Value Ref Range   WBC 7.1 3.8 - 10.6 K/uL   RBC 4.77 4.40 - 5.90 MIL/uL   Hemoglobin 14.3 13.0 - 18.0 g/dL   HCT  16.1 09.6 - 04.5 %   MCV 91.1 80.0 - 100.0 fL   MCH 29.9 26.0 - 34.0 pg   MCHC 32.8 32.0 - 36.0 g/dL   RDW 40.9 (H) 81.1 - 91.4 %   Platelets 262 150 - 440 K/uL  Basic metabolic panel  Result Value Ref Range   Sodium 130 (L) 135 - 145 mmol/L   Potassium 3.7 3.5 - 5.1 mmol/L   Chloride 96 (L) 101 - 111 mmol/L   CO2 25 22 - 32 mmol/L   Glucose, Bld 136 (H) 65 - 99 mg/dL   BUN 9 6 - 20 mg/dL   Creatinine, Ser 7.82 0.61 - 1.24 mg/dL   Calcium 9.0 8.9 - 95.6 mg/dL   GFR calc non Af Amer >60 >60 mL/min   GFR calc Af Amer >60 >60 mL/min   Anion gap 9 5 - 15  Hepatic function panel  Result Value Ref Range   Total Protein 6.8 6.5 - 8.1 g/dL   Albumin 3.5 3.5 - 5.0 g/dL   AST 19 15 - 41 U/L   ALT 16 (L) 17 - 63 U/L   Alkaline Phosphatase 59 38 - 126 U/L   Total Bilirubin 0.3 0.3 - 1.2 mg/dL   Bilirubin, Direct <2.1 (L) 0.1 - 0.5 mg/dL   Indirect Bilirubin NOT CALCULATED 0.3 - 0.9 mg/dL  TSH  Result Value Ref Range   TSH 2.172 0.350 - 4.500 uIU/mL  Urinalysis complete, with microscopic [facility])  Result Value Ref Range   Color, Urine YELLOW (A) YELLOW   APPearance CLOUDY (A) CLEAR   Glucose, UA NEGATIVE NEGATIVE mg/dL   Bilirubin Urine NEGATIVE NEGATIVE   Ketones, ur NEGATIVE NEGATIVE mg/dL   Specific Gravity, Urine 1.013 1.005 - 1.030   Hgb urine dipstick 1+ (A) NEGATIVE   pH 6.0 5.0 - 8.0   Protein, ur NEGATIVE NEGATIVE mg/dL   Nitrite POSITIVE (A) NEGATIVE   Leukocytes, UA 3+ (A) NEGATIVE   RBC / HPF 0-5 0 - 5 RBC/hpf   WBC, UA TOO NUMEROUS TO COUNT 0 - 5 WBC/hpf   Bacteria, UA MANY (A) NONE SEEN   Squamous Epithelial / LPF 0-5 (A) NONE SEEN   Mucous PRESENT   Troponin I  Result Value Ref Range   Troponin I <0.03 <0.031 ng/mL      Assessment & Plan:   Problem List Items Addressed This Visit      Cardiovascular and Mediastinum   Hypertension    Better at home through hospice care. Stable. Continue medications for BPH continue to monitor.           Genitourinary    BPH (benign prostatic hyperplasia)    Doing well on current regimen. Continue to monitor.       Relevant  Medications   tamsulosin (FLOMAX) 0.4 MG CAPS capsule   dutasteride (AVODART) 0.5 MG capsule     Other   Encounter for hospice care - Primary    Continue hospice care. Continue to monitor. Patient continues to want CMO.      Peripheral edema    Continue to work on elevation. Doing much better. The current medical regimen is effective;  continue present plan and medications.        Other Visit Diagnoses    Incomplete bladder emptying        Relevant Medications    tamsulosin (FLOMAX) 0.4 MG CAPS capsule        Follow up plan: Return in about 4 months (around 05/22/2015).

## 2015-01-19 NOTE — Assessment & Plan Note (Signed)
Continue hospice care. Continue to monitor. Patient continues to want CMO.

## 2015-01-19 NOTE — Assessment & Plan Note (Signed)
Continue to work on elevation. Doing much better. The current medical regimen is effective;  continue present plan and medications.

## 2015-01-19 NOTE — Assessment & Plan Note (Signed)
Doing well on current regimen. Continue to monitor

## 2015-04-18 ENCOUNTER — Telehealth: Payer: Self-pay | Admitting: Family Medicine

## 2015-05-17 NOTE — Telephone Encounter (Signed)
[facility] called to notify they pronounced the patients deceased @ 8:36am today. Thanks.

## 2015-05-17 DEATH — deceased

## 2015-05-23 ENCOUNTER — Ambulatory Visit: Payer: Medicare Other | Admitting: Family Medicine

## 2015-06-19 IMAGING — CR DG ABDOMEN 1V
1 series · 2 of 2 positions shown · non-contrast
Comparison: CT of 08/26/2012

CLINICAL DATA: Recurrent urinary tract infection. History kidney
stones.

EXAM:
ABDOMEN - 1 VIEW

[Series 1: kdxr kidney ureter bladder · 0.14mm/px · 2 of 2 slices shown]
[im 1/2]
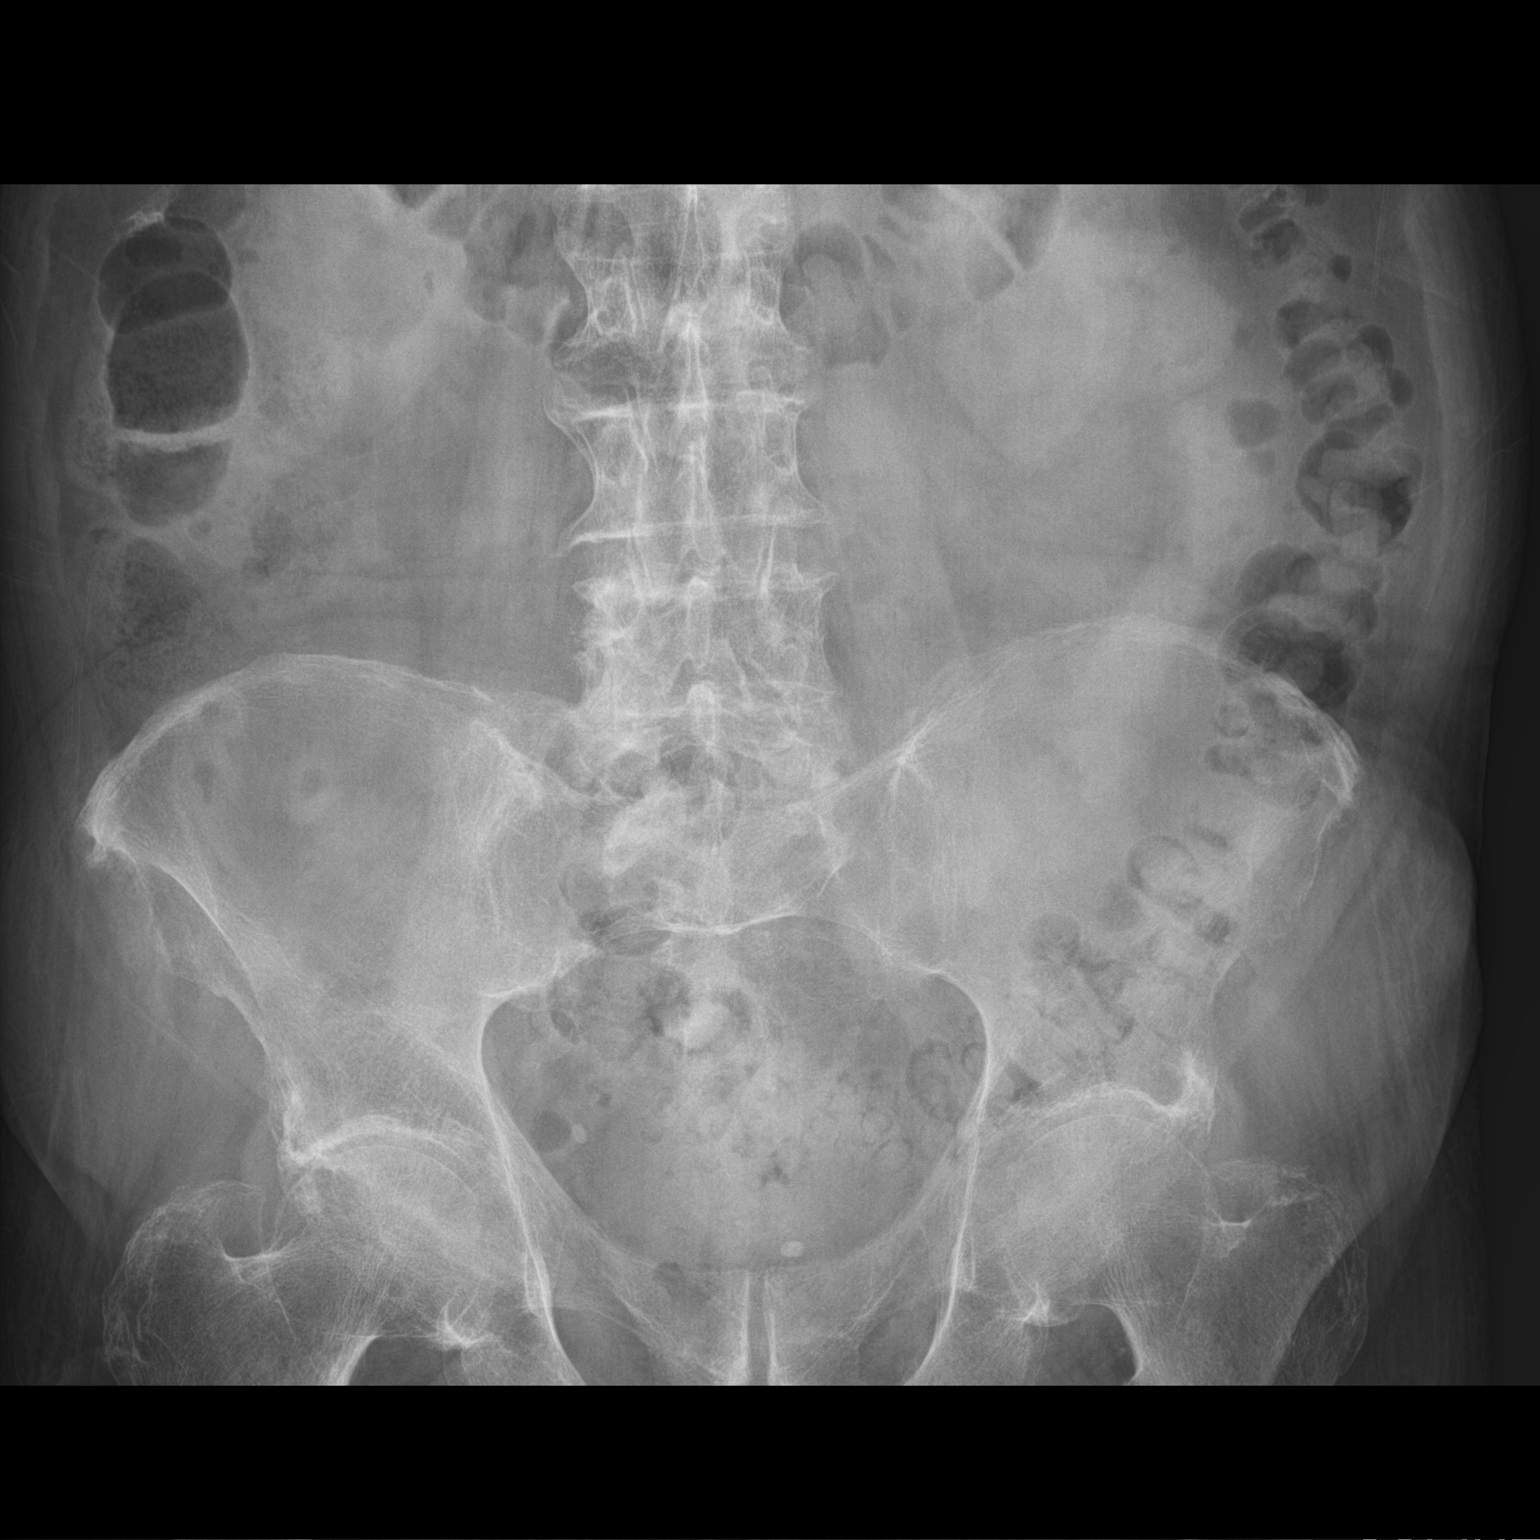
[im 2/2]
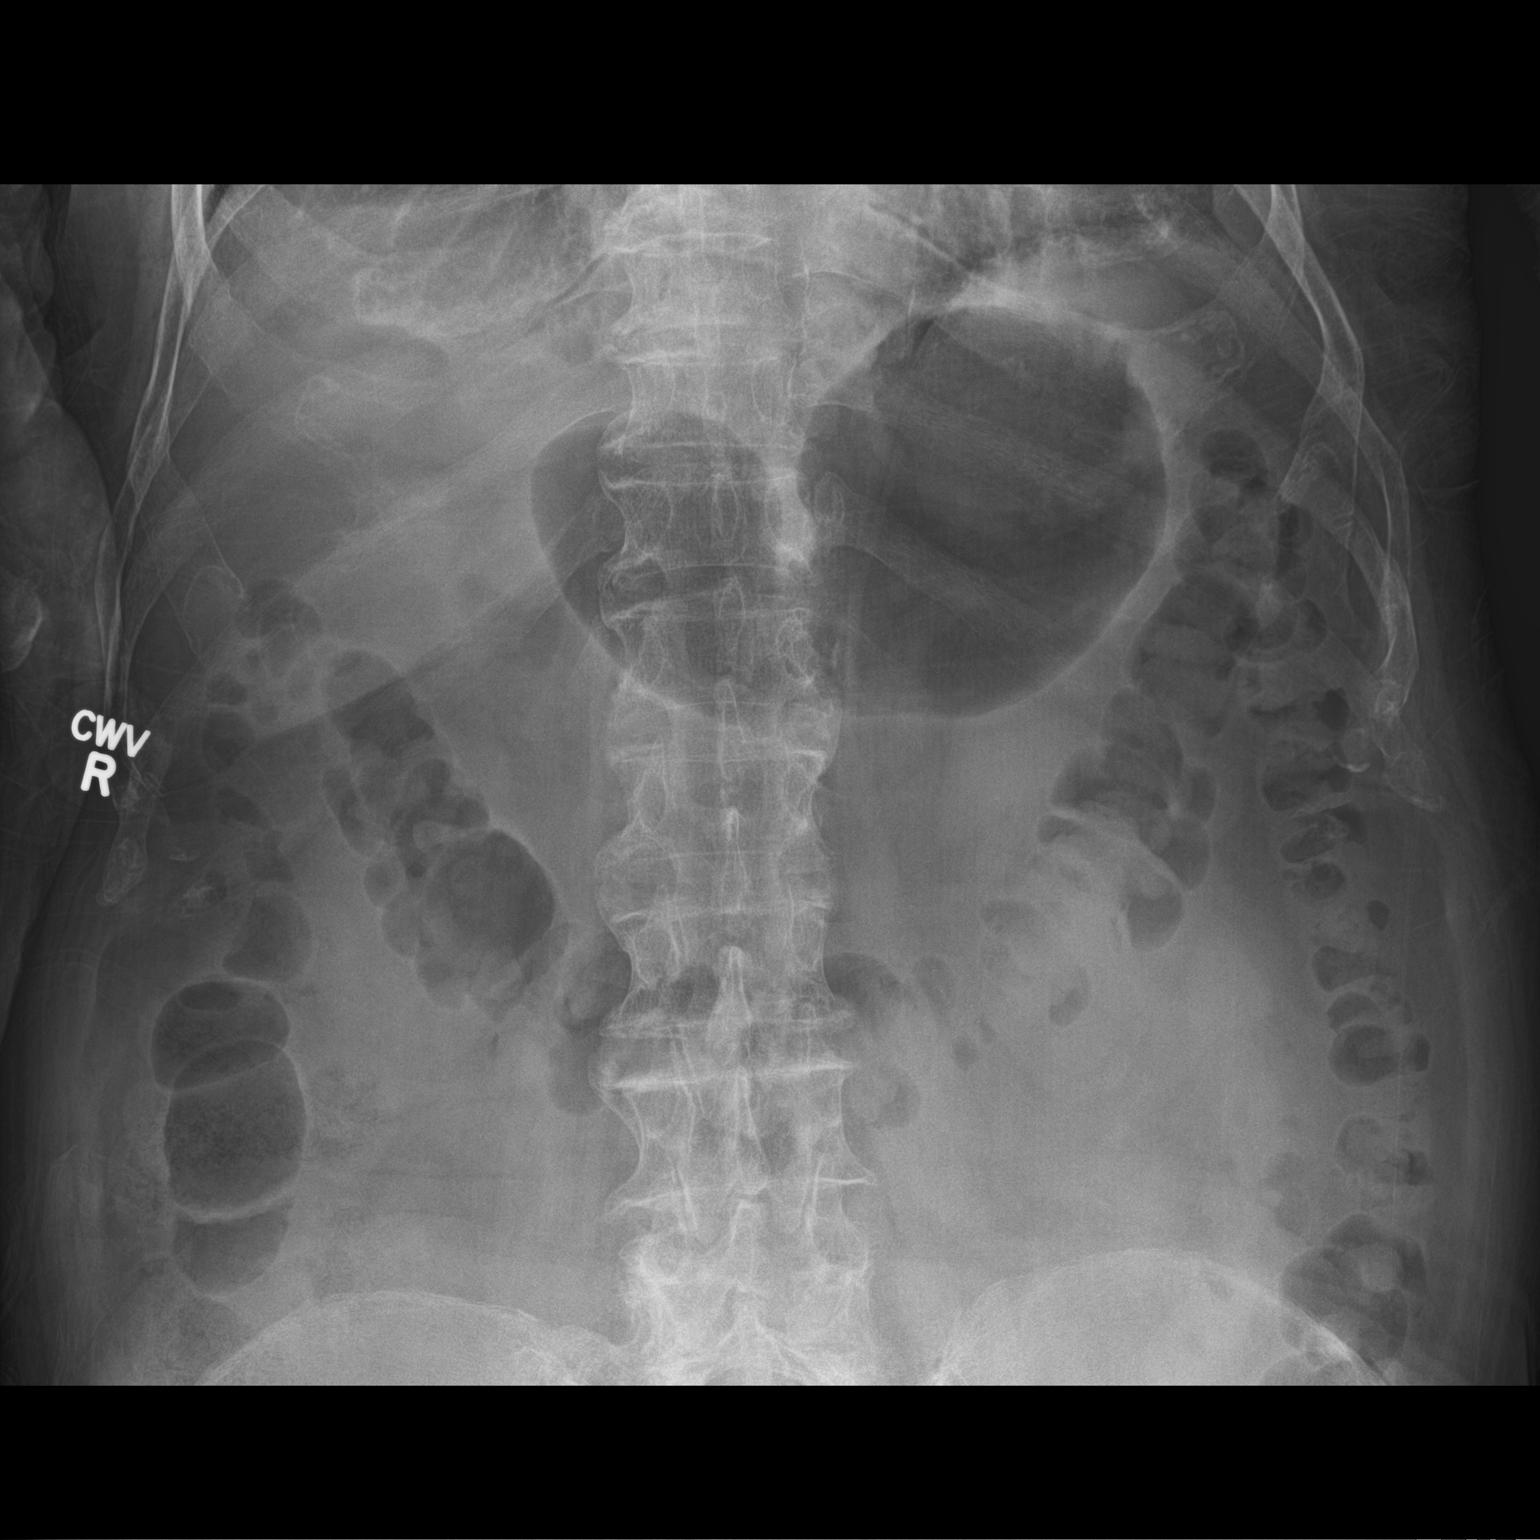

[2 of 2 positions shown; findings below may reference images not displayed]

FINDINGS: Two supine views. Borderline gaseous distension of the stomach. Gas
within normal caliber colon. No small bowel distension. No abnormal
abdominal calcifications. No appendicolith. Probable phleboliths in
the pelvis.
IMPRESSION: No acute findings.  Borderline gastric distension.
